# Patient Record
Sex: Male | Born: 1950 | Race: White | Hispanic: No | Marital: Married | Smoking: Never smoker
Health system: Southern US, Community
[De-identification: ages and names within clinical notes are randomized; demographics above are authoritative.]

## PROBLEM LIST (undated history)

## (undated) DIAGNOSIS — R42 Dizziness and giddiness: Secondary | ICD-10-CM

## (undated) DIAGNOSIS — G459 Transient cerebral ischemic attack, unspecified: Secondary | ICD-10-CM

## (undated) DIAGNOSIS — E119 Type 2 diabetes mellitus without complications: Secondary | ICD-10-CM

---

## 2015-06-29 ENCOUNTER — Observation Stay
Admission: EM | Admit: 2015-06-29 | Discharge: 2015-06-30 | Disposition: A | Discharge status: Home / Self Care | Payer: BLUE CROSS/BLUE SHIELD | Attending: Internal Medicine | Admitting: Internal Medicine

## 2015-06-29 ENCOUNTER — Encounter: Payer: Self-pay | Admitting: Emergency Medicine

## 2015-06-29 DIAGNOSIS — F329 Major depressive disorder, single episode, unspecified: Secondary | ICD-10-CM | POA: Diagnosis not present

## 2015-06-29 DIAGNOSIS — I208 Other forms of angina pectoris: Secondary | ICD-10-CM

## 2015-06-29 DIAGNOSIS — Z794 Long term (current) use of insulin: Secondary | ICD-10-CM | POA: Insufficient documentation

## 2015-06-29 DIAGNOSIS — I639 Cerebral infarction, unspecified: Secondary | ICD-10-CM

## 2015-06-29 DIAGNOSIS — R110 Nausea: Secondary | ICD-10-CM | POA: Diagnosis not present

## 2015-06-29 DIAGNOSIS — Z79899 Other long term (current) drug therapy: Secondary | ICD-10-CM | POA: Insufficient documentation

## 2015-06-29 DIAGNOSIS — Z7982 Long term (current) use of aspirin: Secondary | ICD-10-CM | POA: Insufficient documentation

## 2015-06-29 DIAGNOSIS — R197 Diarrhea, unspecified: Secondary | ICD-10-CM | POA: Insufficient documentation

## 2015-06-29 DIAGNOSIS — E785 Hyperlipidemia, unspecified: Secondary | ICD-10-CM | POA: Diagnosis not present

## 2015-06-29 DIAGNOSIS — R61 Generalized hyperhidrosis: Secondary | ICD-10-CM | POA: Insufficient documentation

## 2015-06-29 DIAGNOSIS — E871 Hypo-osmolality and hyponatremia: Secondary | ICD-10-CM | POA: Insufficient documentation

## 2015-06-29 DIAGNOSIS — R42 Dizziness and giddiness: Secondary | ICD-10-CM | POA: Insufficient documentation

## 2015-06-29 DIAGNOSIS — R9431 Abnormal electrocardiogram [ECG] [EKG]: Secondary | ICD-10-CM | POA: Insufficient documentation

## 2015-06-29 DIAGNOSIS — R55 Syncope and collapse: Secondary | ICD-10-CM | POA: Diagnosis present

## 2015-06-29 DIAGNOSIS — R531 Weakness: Secondary | ICD-10-CM | POA: Insufficient documentation

## 2015-06-29 DIAGNOSIS — J029 Acute pharyngitis, unspecified: Secondary | ICD-10-CM | POA: Diagnosis not present

## 2015-06-29 DIAGNOSIS — Z8673 Personal history of transient ischemic attack (TIA), and cerebral infarction without residual deficits: Secondary | ICD-10-CM | POA: Insufficient documentation

## 2015-06-29 DIAGNOSIS — R112 Nausea with vomiting, unspecified: Secondary | ICD-10-CM

## 2015-06-29 DIAGNOSIS — E119 Type 2 diabetes mellitus without complications: Secondary | ICD-10-CM | POA: Insufficient documentation

## 2015-06-29 HISTORY — DX: Dizziness and giddiness: R42

## 2015-06-29 HISTORY — DX: Transient cerebral ischemic attack, unspecified: G45.9

## 2015-06-29 HISTORY — DX: Type 2 diabetes mellitus without complications: E11.9

## 2015-06-29 LAB — BASIC METABOLIC PANEL
ANION GAP: 10 (ref 5–15)
BUN: 20 mg/dL (ref 6–20)
CALCIUM: 9.2 mg/dL (ref 8.9–10.3)
CO2: 23 mmol/L (ref 22–32)
Chloride: 100 mmol/L — ABNORMAL LOW (ref 101–111)
Creatinine, Ser: 0.91 mg/dL (ref 0.61–1.24)
GLUCOSE: 332 mg/dL — AB (ref 65–99)
POTASSIUM: 3.6 mmol/L (ref 3.5–5.1)
SODIUM: 133 mmol/L — AB (ref 135–145)

## 2015-06-29 LAB — GLUCOSE, CAPILLARY: Glucose-Capillary: 192 mg/dL — ABNORMAL HIGH (ref 65–99)

## 2015-06-29 LAB — CBC
HCT: 45.3 % (ref 40.0–52.0)
Hemoglobin: 15.2 g/dL (ref 13.0–18.0)
MCH: 32.3 pg (ref 26.0–34.0)
MCHC: 33.5 g/dL (ref 32.0–36.0)
MCV: 96.2 fL (ref 80.0–100.0)
PLATELETS: 200 10*3/uL (ref 150–440)
RBC: 4.71 MIL/uL (ref 4.40–5.90)
RDW: 12.4 % (ref 11.5–14.5)
WBC: 10.4 10*3/uL (ref 3.8–10.6)

## 2015-06-29 LAB — TROPONIN I

## 2015-06-29 NOTE — ED Notes (Signed)
89 yom presents to ED with c/o near syncope. Per EMS, [facility] [place] [facility] and was in town for a [place]. Around 2000 patient began feeling "weird" (hot, sweaty, and lightheaded). Hx of TIA's. Patient is a type 2 diabetic and felt his blood sugar was dropping. Patient ate a hamburger an hour prior to feeling this way. Patient drank a coke and took a glucose tablet and called EMS. Blood sugar on EMS arrival via fingerstick was 179. Patients pupils became very dilated and patient was near syncopal. EMS started an IV and checked his blood sugar  and checked then and it was 281. Patient recently dx with a sinus infection and has been taking Z-pack since this past Tuesday. Patient is currently A&O. Denies pain.

## 2015-06-30 ENCOUNTER — Observation Stay: Payer: BLUE CROSS/BLUE SHIELD

## 2015-06-30 ENCOUNTER — Observation Stay (HOSPITAL_BASED_OUTPATIENT_CLINIC_OR_DEPARTMENT_OTHER)
Admit: 2015-06-30 | Discharge: 2015-06-30 | Disposition: A | Discharge status: Home / Self Care | Payer: BLUE CROSS/BLUE SHIELD | Attending: Physician Assistant | Admitting: Physician Assistant

## 2015-06-30 ENCOUNTER — Emergency Department: Payer: BLUE CROSS/BLUE SHIELD

## 2015-06-30 DIAGNOSIS — R110 Nausea: Secondary | ICD-10-CM | POA: Diagnosis present

## 2015-06-30 DIAGNOSIS — R55 Syncope and collapse: Secondary | ICD-10-CM

## 2015-06-30 LAB — GLUCOSE, CAPILLARY
GLUCOSE-CAPILLARY: 151 mg/dL — AB (ref 65–99)
Glucose-Capillary: 127 mg/dL — ABNORMAL HIGH (ref 65–99)
Glucose-Capillary: 216 mg/dL — ABNORMAL HIGH (ref 65–99)

## 2015-06-30 LAB — TROPONIN I
Troponin I: <0.03 ng/mL (ref <0.031)
Troponin I: <0.03 ng/mL (ref <0.031)
Troponin I: <0.03 ng/mL (ref <0.031)
Troponin I: <0.03 ng/mL (ref <0.031)

## 2015-06-30 LAB — LIPID PANEL
Cholesterol: 169 mg/dL (ref 0–200)
HDL: 76 mg/dL (ref >40)
LDL Cholesterol: 74 mg/dL (ref 0–99)
Total CHOL/HDL Ratio: 2.2 RATIO
Triglycerides: 94 mg/dL (ref <150)
VLDL: 19 mg/dL (ref 0–40)

## 2015-06-30 LAB — HEMOGLOBIN A1C: Hgb A1c MFr Bld: 8.2 % — ABNORMAL HIGH (ref 4.0–6.0)

## 2015-06-30 LAB — TSH: TSH: 3.612 u[IU]/mL (ref 0.350–4.500)

## 2015-06-30 MED ORDER — LORAZEPAM 2 MG/ML IJ SOLN
2.0000 mg | Freq: Once | INTRAMUSCULAR | Status: AC
  Administered 2015-06-30: 2 mg via INTRAVENOUS
  Filled 2015-06-30: qty 1

## 2015-06-30 MED ORDER — INSULIN ASPART 100 UNIT/ML ~~LOC~~ SOLN
0.0000 [IU] | Freq: Three times a day (TID) | SUBCUTANEOUS | Status: DC
  Administered 2015-06-30: 3 [IU] via SUBCUTANEOUS
  Filled 2015-06-30: qty 3

## 2015-06-30 MED ORDER — ATORVASTATIN CALCIUM 20 MG PO TABS
40.0000 mg | ORAL_TABLET | Freq: Every evening | ORAL | Status: DC
  Administered 2015-06-30: 40 mg via ORAL
  Filled 2015-06-30: qty 2

## 2015-06-30 MED ORDER — HEPARIN SODIUM (PORCINE) 5000 UNIT/ML IJ SOLN: 5000.0000 [IU] | Freq: Three times a day (TID) | INTRAMUSCULAR | Status: DC

## 2015-06-30 MED ORDER — SODIUM CHLORIDE 0.9 % IV SOLN
INTRAVENOUS | Status: DC
  Administered 2015-06-30 (×2): via INTRAVENOUS

## 2015-06-30 MED ORDER — ASPIRIN EC 81 MG PO TBEC
162.0000 mg | DELAYED_RELEASE_TABLET | Freq: Every day | ORAL | Status: DC
  Administered 2015-06-30: 162 mg via ORAL
  Filled 2015-06-30: qty 2

## 2015-06-30 MED ORDER — DOCUSATE SODIUM 100 MG PO CAPS
100.0000 mg | ORAL_CAPSULE | Freq: Two times a day (BID) | ORAL | Status: DC
  Administered 2015-06-30: 100 mg via ORAL
  Filled 2015-06-30: qty 1

## 2015-06-30 MED ORDER — DIPHENHYDRAMINE HCL 50 MG/ML IJ SOLN
12.5000 mg | Freq: Four times a day (QID) | INTRAMUSCULAR | Status: DC | PRN
  Filled 2015-06-30: qty 0.25

## 2015-06-30 MED ORDER — ONDANSETRON HCL 4 MG/2ML IJ SOLN: 4.0000 mg | Freq: Four times a day (QID) | INTRAMUSCULAR | Status: DC | PRN

## 2015-06-30 MED ORDER — SODIUM CHLORIDE 0.9 % IJ SOLN
3.0000 mL | Freq: Two times a day (BID) | INTRAMUSCULAR | Status: DC
  Administered 2015-06-30: 3 mL via INTRAVENOUS

## 2015-06-30 MED ORDER — INSULIN GLARGINE 100 UNIT/ML ~~LOC~~ SOLN
6.0000 [IU] | Freq: Every day | SUBCUTANEOUS | Status: DC
  Filled 2015-06-30: qty 0.06

## 2015-06-30 MED ORDER — PAROXETINE HCL 20 MG PO TABS
10.0000 mg | ORAL_TABLET | Freq: Every day | ORAL | Status: DC
  Administered 2015-06-30: 10 mg via ORAL
  Filled 2015-06-30: qty 1

## 2015-06-30 MED ORDER — MORPHINE SULFATE (PF) 2 MG/ML IV SOLN: 2.0000 mg | INTRAVENOUS | Status: DC | PRN

## 2015-06-30 MED ORDER — ACETAMINOPHEN 650 MG RE SUPP: 650.0000 mg | Freq: Four times a day (QID) | RECTAL | Status: DC | PRN

## 2015-06-30 MED ORDER — ACETAMINOPHEN 325 MG PO TABS
650.0000 mg | ORAL_TABLET | Freq: Four times a day (QID) | ORAL | Status: DC | PRN
Start: 2015-06-30 — End: 2015-06-30

## 2015-06-30 MED ORDER — ONDANSETRON HCL 4 MG PO TABS: 4.0000 mg | ORAL_TABLET | Freq: Four times a day (QID) | ORAL | Status: DC | PRN

## 2015-06-30 MED ORDER — INSULIN ASPART 100 UNIT/ML ~~LOC~~ SOLN: 0.0000 [IU] | Freq: Every day | SUBCUTANEOUS | Status: DC

## 2015-06-30 NOTE — Progress Notes (Addendum)
RN witnessed patient ambulate to the bathroom this AM with no difficulty. With medication administration this AM, patient complained of severe exhaustion and RN put bed alarm on. RN received call from aide 15-20 minutes prior to this and reported patient was needing assistance of 3 nursing students to ambulate to the bathroom. Patient was helped to commode and had diarrhea, complained of dizziness and became very emotional. RN and nursing instructor aided patient back to bed via wheelchair and required +2 assist to return to bed. Bed alarm placed and patient instructed to call next time he gets up. Pt verbalizes understanding of maintaining his safety while here. RN is attempting to page assigning MD to find out who attending will be, as computer does not currently show this.   Update: 9:49 AM MD reached, Dr. Allena KatzPatel is seeing patient. Rounding on floor at this time and updated on patients neurological change. RN asked if MRI would be helpful at this point. MD to see and assess patient.

## 2015-06-30 NOTE — Progress Notes (Signed)
PT Cancellation Note  Patient Details Name: Ronnell FreshwaterJames David Agne MRN: [id number] DOB: 03/07/1951   Cancelled Treatment:    Reason Eval/Treat Not Completed: Other (comment). Pt currently out of room for imaging. Will re-attempt at later time.   Saba Neuman 06/30/2015, 1:25 PM Elizabeth PalauStephanie Stepheni Cameron, PT, DPT [phone]

## 2015-06-30 NOTE — Evaluation (Signed)
Physical Therapy Evaluation Patient Details Name: Mike Ramos MRN: [id number] DOB: 11/23/1950 Today's Date: 06/30/2015   History of Present Illness  [facility], started feeling "strange" having some dizziness, sweating.  He reports he has had similar issues with his diabetes as well as previous TIA/CVA  Clinical Impression  Pt shows very good strength, balance and general confidence with all acts.  He is able to maintain narrow BOS, eyes closed balance with perturbations, negotiates up/down steps easily w/o rails and ambulates > 1.0 m/sec.  Pt at his baseline level and does not need further PT intervention.     Follow Up Recommendations No PT follow up    Equipment Recommendations       Recommendations for Other Services       Precautions / Restrictions Restrictions Weight Bearing Restrictions: No      Mobility  Bed Mobility Overal bed mobility: Independent                Transfers Overall transfer level: Independent               General transfer comment: Pt has no issues with getting to standing and shows good confidence and safety  Ambulation/Gait Ambulation/Gait assistance: Independent Ambulation Distance (Feet): 200 Feet Assistive device: None     Gait velocity interpretation: >2.62 ft/sec, indicative of independent community ambulator General Gait Details: Pt with no issues during ambualtion with good speed, confidence and balance.   Stairs Stairs: Yes       General stair comments: Pt able to negotiate up/down 4 steps w/o rails and w/o issue  Wheelchair Mobility    Modified Rankin (Stroke Patients Only)       Balance Overall balance assessment: Independent                                           Pertinent Vitals/Pain Pain Assessment: No/denies pain    Home Living Family/patient expects to be discharged to:: Private residence Living Arrangements: Spouse/significant other   Type of  Home: House Home Access: Stairs to enter   [employer] Corporation[employer]trance Stairs-Number of Steps: 2          Prior Function Level of Independence: Independent         Comments: very active, plays in a band, works full time and exercises regularly     Hand Dominance        Extremity/Trunk Assessment   Upper Extremity Assessment: Overall WFL for tasks assessed           Lower Extremity Assessment: Overall WFL for tasks assessed         Communication   Communication: No difficulties  Cognition Arousal/Alertness: Awake/alert Behavior During Therapy: WFL for tasks assessed/performed Overall Cognitive Status: Within Functional Limits for tasks assessed                      General Comments      Exercises        Assessment/Plan    PT Assessment Patent does not need any further PT services  PT Diagnosis Generalized weakness;Difficulty walking   PT Problem List    PT Treatment Interventions     PT Goals (Current goals can be found in the Care Plan section) Acute Rehab PT Goals PT Goal Formulation: With patient/family    Frequency     Barriers to discharge  Co-evaluation               End of Session Equipment Utilized During Treatment: Gait belt Activity Tolerance: Patient tolerated treatment well Patient left: in bed Nurse Communication: Mobility status    Functional Assessment Tool Used: clinical judgement Functional Limitation: Mobility: Walking and moving around Mobility: Walking and Moving Around Current Status [phone]): 0 percent impaired, limited or restricted Mobility: Walking and Moving Around Goal Status [phone]): 0 percent impaired, limited or restricted Mobility: Walking and Moving Around Discharge Status [phone]): 0 percent impaired, limited or restricted    Time: 2956-2130 PT Time Calculation (min) (ACUTE ONLY): 12 min   Charges:   PT Evaluation $Initial PT Evaluation Tier I: 1 Procedure     PT G Codes:   PT G-Codes **NOT FOR  INPATIENT CLASS** Functional Assessment Tool Used: clinical judgement Functional Limitation: Mobility: Walking and moving around Mobility: Walking and Moving Around Current Status (Q6578): 0 percent impaired, limited or restricted Mobility: Walking and Moving Around Goal Status (I6962): 0 percent impaired, limited or restricted Mobility: Walking and Moving Around Discharge Status (X5284): 0 percent impaired, limited or restricted   Mike Ramos, PT, DPT [phone]  Mike Ramos 06/30/2015, 3:02 PM

## 2015-06-30 NOTE — Progress Notes (Signed)
Pt. Discharged to home via wc with wife. Discharge instructions and medication regimen reviewed at bedside with patient. Pt. verbalizes understanding of instructions and medication regimen. Patient assessment unchanged from this morning. TELE and IV discontinued per policy.

## 2015-06-30 NOTE — ED Provider Notes (Signed)
[facility] Emergency Department Provider Note  REMINDER - THIS NOTE IS NOT A FINAL MEDICAL RECORD UNTIL IT IS SIGNED. UNTIL THEN, THE CONTENT BELOW MAY REFLECT INFORMATION FROM A DOCUMENTATION TEMPLATE, NOT THE ACTUAL PATIENT VISIT. ____________________________________________  Time seen: Approximately 940P  I have reviewed the triage vital signs and the nursing notes.   HISTORY  Chief Complaint Near Syncope    HPI Mike Ramos is a 64 y.o. male puts a previous history of diabetes, currently utilizing Lantus, as well as vertigo.  He also tells me he had a "TIA" once previously, however he was told this was due to his low blood sugar and denies that he was ever told he needed to be on aspirin or ever had a stroke or mini stroke.  No history of heart disease.  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 He reports this happened before when his sugar has been low. He got sweaty and lightheaded. He immediately started taking sugar tablets, and drank a Coke. He then reports that his symptoms improved significantly after about 15-20 minutes. At the present time all symptoms are resolved. His friend who is with him reports that he never lost consciousness, though he did appear quite pale and so he was going to at one point. EMS also reports to me that he had no syncope.  He is not having any chest pain, trouble breathing. He denies any numbness, tingling, facial droop, headache or weakness in arm or leg.  He does use alcohol but only about 1-2 drinks per day and no history of withdrawals or heavy use on a regular basis.   Past Medical History  Diagnosis Date  . TIA (transient ischemic attack)   . Diabetes mellitus without complication (HCC)   . Vertigo     There are no active problems to display for this patient.   No past surgical history on file.  Current Outpatient Rx  Name  Route   Sig  Dispense  Refill  . atorvastatin (LIPITOR) 40 MG tablet   Oral   Take 1 tablet by mouth every evening.         Marland Kitchen azithromycin (ZITHROMAX) 250 MG tablet   Oral   Take 1 tablet by mouth daily.         Marland Kitchen PARoxetine (PAXIL) 10 MG tablet   Oral   Take 1 tablet by mouth daily.           Allergies Codeine  No family history on file.  Social History Social History  Substance Use Topics  . Smoking status: Never Smoker   . Smokeless tobacco: Not on file  . Alcohol Use: Yes     Comment: 1 bottle of beer a day    Review of Systems Constitutional: No fever/chills Eyes: No visual changes. ENT: No sore throat. Cardiovascular: Denies chest pain. Respiratory: Denies shortness of breath. Gastrointestinal: No abdominal pain.  No vomiting.  No diarrhea.  No constipation. Genitourinary: Negative for dysuria. Musculoskeletal: Negative for back pain. Skin: Negative for rash. Neurological: Negative for headaches, focal weakness or numbness.  10-point ROS otherwise negative.  ____________________________________________   PHYSICAL EXAM:  VITAL SIGNS: ED Triage Vitals  Enc Vitals Group     BP 06/29/15 2100 189/95 mmHg     Pulse Rate 06/29/15 2100 66     Resp 06/29/15 2100 15     Temp 06/29/15 2108 97.4 F (36.3 C)  Temp src --      SpO2 06/29/15 2100 100 %     Weight 06/29/15 2108 190 lb (86.183 kg)     Height 06/29/15 2108 6' (1.829 m)     Head Cir --      Peak Flow --      Pain Score --      Pain Loc --      Pain Edu? --      Excl. in GC? --    Based on the patient complaint we performed a very thorough cardio, pulmonary, neurologic exam.  Constitutional: Alert and oriented. Well appearing and in no acute distress. Eyes: Conjunctivae are normal. PERRL. EOMI. Head: Atraumatic. Nose: No congestion/rhinnorhea. Mouth/Throat: Mucous membranes are moist.  Oropharynx non-erythematous. Neck: No stridor.   Cardiovascular: Normal rate, regular rhythm. Grossly  normal heart sounds.  Good peripheral circulation. Respiratory: Normal respiratory effort.  No retractions. Lungs CTAB. Gastrointestinal: Soft and nontender. No distention. No abdominal bruits. No CVA tenderness. Musculoskeletal: No lower extremity tenderness nor edema.  No joint effusions. Neurologic:  Normal speech and language. No gross focal neurologic deficits are appreciated. No gait instability. Proximal to the bathroom well without any assistance.  NIH score equals 0, performed by me at bedside. The patient has no pronator drift. The patient has normal cranial nerve exam. Extraocular movements are normal. Visual fields are normal. Patient has 5 out of 5 strength in all extremities. There is no numbness or gross, acute sensory abnormality in the extremities bilaterally. No speech disturbance. No dysarthria. No aphasia. No ataxia. Normal finger nose finger bilat. Patient speaking in full and clear sentences.   Skin:  Skin is warm, dry and intact. No rash noted. Psychiatric: Mood and affect are normal. Speech and behavior are normal.  ____________________________________________   LABS (all labs ordered are listed, but only abnormal results are displayed)  Labs Reviewed  BASIC METABOLIC PANEL - Abnormal; Notable for the following:    Sodium 133 (*)    Chloride 100 (*)    Glucose, Bld 332 (*)    All other components within normal limits  GLUCOSE, CAPILLARY - Abnormal; Notable for the following:    Glucose-Capillary 192 (*)    All other components within normal limits  GLUCOSE, CAPILLARY - Abnormal; Notable for the following:    Glucose-Capillary 151 (*)    All other components within normal limits  CBC  TROPONIN I  TROPONIN I  CBG MONITORING, ED  CBG MONITORING, ED   ____________________________________________  EKG  Reviewed and interpreted by me at 2105 Heart rate 62 There is minimal J-point versus ST elevation noted in V3, with likely J-point elevation in  lateral leads. Reviewed and discussed with Dr. Tomie ChinaZane who advises likely early repolarization, but continue serial EKG monitoring.  Normal QRS, normal QTC normal PR interval  Reviewed and interpreted by me at 2144 Heart rate 58 There is minimal J-point versus ST elevation noted in V3, with likely J-point elevation in lateral leads.  Normal QRS, normal QTC normal PR interval  Reviewed and interpreted by me at 0040 Heart rate 59 There is minimal J-point versus ST elevation noted in V3, with likely J-point elevation in lateral leads.  Normal QRS, normal QTC normal PR interval  ____________________________________________  RADIOLOGY  CT head pending at the site of transfer of care ____________________________________________   PROCEDURES  Procedure(s) performed: None  Critical Care performed: No  ____________________________________________   INITIAL IMPRESSION / ASSESSMENT AND PLAN / ED COURSE  Pertinent labs &  imaging results that were available during my care of the patient were reviewed by me and considered in my medical decision making (see chart for details).  Patient presents with a resolved episode of severe nausea diaphoresis, and feeling as though he was about to pass out but did not. He did not have syncope, and his exam including complete resolution of symptoms as well as neurologic examination and cardio pulmonary exam reassuring. EKG discussed with Dr. Tomie China of cardiology who reviewed it via MUSE with me over the phone, he advises that likely early repolarization though acute ischemic abnormality can be seen with similar T-wave appearance advises serial EKGs and serial observation and monitoring. Patient's visit we do not have any chest pain or trouble breathing. His vital signs are reassuring, though he is moderately hypertensive. We will observe him closely in the ER, particularly as we evaluate and rule out cardiac  symptoms.  ----------------------------------------- 12:35 AM on 06/30/2015 -----------------------------------------  Micah Flesher to reevaluate the patient reports that about 5 minutes ago his symptoms Started to come back if he feels nauseated, sweaty, and various generally ill feeling. He remains on a monitor and appears stable with reassuring and stable neurologic examination. Based on his previous history, question lead EKG in ongoing observation status for chest discomfort or possible acute coronary syndrome was silent MI and history of diabetes will obtain a repeat EKG now, repeat glucose, and I'll add a CT of the head to evaluate for intracranial hemorrhage, though no clear evidence of acute stroke at this time.  Ongoing care and disposition assigned to Dr. sung will follow-up on CT head, plan is to admit the patient who is agreeable as he is essentially failing are observation with recurrence of symptoms now. ____________________________________________   FINAL CLINICAL IMPRESSION(S) / ED DIAGNOSES  Final diagnoses:  Near syncope  Nausea vomiting and diarrhea  Anginal equivalent (HCC)      Sharyn Creamer, MD 06/30/15 [phone]

## 2015-06-30 NOTE — Progress Notes (Addendum)
[facility] [place] [facility]                                                                                                                                                                                            Patient Demographics   Mike OxfordJames Ramos, is a 64 y.o. male, DOB - 08/14/1950, ZOX:[id number]  Admit date - 06/29/2015   Admitting Physician Mike NatalMichael S Diamond, MD  Outpatient Primary MD for the patient is Pcp Not In System   LOS -   Subjective: Patient admitted with near syncope due to hypoglycemia was also having nausea. She was thought to have some EKG changes therefore recommended to be hospitalized. Currently he denies any chest pain nausea is resolved 8 all his breakfast. However according to the family they have noticed he's had some issues with slurred speech recently. And when the nurse tried ambulating and patient did become very weak.     Review of Systems:   CONSTITUTIONAL: No documented fever. No fatigue, weakness. No weight gain, no weight loss.  EYES: No blurry or double vision.  ENT: No tinnitus. No postnasal drip. No redness of the oropharynx.  RESPIRATORY: No cough, no wheeze, no hemoptysis. No dyspnea.  CARDIOVASCULAR: No chest pain. No orthopnea. No palpitations. No syncope.  GASTROINTESTINAL: No nausea, no vomiting or diarrhea. No abdominal pain. No melena or hematochezia.  GENITOURINARY: No dysuria or hematuria.  ENDOCRINE: No polyuria or nocturia. No heat or cold intolerance.  HEMATOLOGY: No anemia. No bruising. No bleeding.  INTEGUMENTARY: No rashes. No lesions.  MUSCULOSKELETAL: No arthritis. No swelling. No gout.  NEUROLOGIC: No numbness, tingling, or ataxia. No seizure-type activity. Slurred speech PSYCHIATRIC: No anxiety. No insomnia. No ADD.    Vitals:   Filed Vitals:   06/30/15 0915 06/30/15 1053 06/30/15 1055 06/30/15 1100  BP: 148/79 138/84 122/74 140/86  Pulse:  62 68 76  Temp:      TempSrc:      Resp:       Height:      Weight:      SpO2:        Wt Readings from Last 3 Encounters:  06/30/15 81.784 kg (180 lb 4.8 oz)     Intake/Output Summary (Last 24 hours) at 06/30/15 1423 Last data filed at 06/30/15 1130  Gross per 24 hour  Intake    240 ml  Output    400 ml  Net   -160 ml    Physical Exam:   GENERAL: Pleasant-appearing in no apparent distress.  HEAD, EYES, EARS, NOSE AND THROAT: Atraumatic, normocephalic. Extraocular muscles are intact. Pupils equal and  reactive to light. Sclerae anicteric. No conjunctival injection. No oro-pharyngeal erythema.  NECK: Supple. There is no jugular venous distention. No bruits, no lymphadenopathy, no thyromegaly.  HEART: Regular rate and rhythm,. No murmurs, no rubs, no clicks.  LUNGS: Clear to auscultation bilaterally. No rales or rhonchi. No wheezes.  ABDOMEN: Soft, flat, nontender, nondistended. Has good bowel sounds. No hepatosplenomegaly appreciated.  EXTREMITIES: No evidence of any cyanosis, clubbing, or peripheral edema.  +2 pedal and radial pulses bilaterally.  NEUROLOGIC: The patient is alert, awake, and oriented x3 with no focal motor or sensory deficits appreciated bilaterally.  SKIN: Moist and warm with no rashes appreciated.  Psych: Not anxious, depressed LN: No inguinal LN enlargement    Antibiotics   Anti-infectives    None      Medications   Scheduled Meds: . aspirin EC  162 mg Oral Daily  . atorvastatin  40 mg Oral QPM  . docusate sodium  100 mg Oral BID  . heparin  5,000 Units Subcutaneous 3 times per day  . insulin aspart  0-5 Units Subcutaneous QHS  . insulin aspart  0-9 Units Subcutaneous TID WC  . insulin glargine  6 Units Subcutaneous QHS  . LORazepam  2 mg Intravenous Once  . PARoxetine  10 mg Oral Daily  . sodium chloride  3 mL Intravenous Q12H   Continuous Infusions: . sodium chloride 125 mL/hr at 06/30/15 1355   PRN Meds:.acetaminophen **OR** acetaminophen, diphenhydrAMINE, morphine injection,  ondansetron **OR** ondansetron (ZOFRAN) IV   Data Review:   Micro Results No results found for this or any previous visit (from the past 240 hour(s)).  Radiology Reports Ct Head Wo Contrast  06/30/2015  CLINICAL DATA:  Lightheadedness and diaphoresis.  Near syncope. EXAM: CT HEAD WITHOUT CONTRAST TECHNIQUE: Contiguous axial images were obtained from the base of the skull through the vertex without intravenous contrast. COMPARISON:  None. FINDINGS: There is no intracranial hemorrhage, mass or evidence of acute infarction. There is focal encephalomalacia in the anterior right temporal lobe, likely sequelae from remote insult such as trauma or infarction. Remainder of the brain is normal in appearance, with normal gray-white differentiation. No extra-axial fluid collection. No bony abnormality. The visible paranasal sinuses are clear except for opacification of several ethmoid air cells. IMPRESSION: No acute intracranial findings. 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 Electronically Signed   By: Ellery Plunk M.D.   On: 06/30/2015 01:13     CBC  Recent Labs Lab 06/29/15 2118  WBC 10.4  HGB 15.2  HCT 45.3  PLT 200  MCV 96.2  MCH 32.3  MCHC 33.5  RDW 12.4    Chemistries   Recent Labs Lab 06/29/15 2118  NA 133*  K 3.6  CL 100*  CO2 23  GLUCOSE 332*  BUN 20  CREATININE 0.91  CALCIUM 9.2   ------------------------------------------------------------------------------------------------------------------ estimated creatinine clearance is 91.2 mL/min (by C-G formula based on Cr of 0.91). ------------------------------------------------------------------------------------------------------------------ No results for input(s): HGBA1C in the last 72 hours. ------------------------------------------------------------------------------------------------------------------  Recent Labs  06/30/15 0919  CHOL 169  HDL 76  LDLCALC 74  TRIG 94   CHOLHDL 2.2   ------------------------------------------------------------------------------------------------------------------  Recent Labs  06/30/15 0317  TSH 3.612   ------------------------------------------------------------------------------------------------------------------ No results for input(s): VITAMINB12, FOLATE, FERRITIN, TIBC, IRON, RETICCTPCT in the last 72 hours.  Coagulation profile No results for input(s): INR, PROTIME in the last 168 hours.  No results for input(s): DDIMER in the last 72 hours.  Cardiac Enzymes  Recent Labs Lab 06/30/15  0020 06/30/15 0507 06/30/15 0919  TROPONINI <0.03 <0.03 <0.03   ------------------------------------------------------------------------------------------------------------------ Invalid input(s): POCBNP    Assessment & Plan   This is a 64 year old Caucasian male admitted for near syncope and mildly abnormal EKG. 1. Near syncope: Possibly secondary to hypoglycemia. Due to some speech changes and gait abnormality will get a MRI of the brain and make sure he is not had a stroke 2. Abnormal EKG: ST segment appears to show early repolarization. Unlikely to be etiology of near syncope. Cardiac enzymes are negative no chest pain needs outpatient follow-up with cardiology 3. Diabetes mellitus type II: We will continue the patient's basal insulin and add sliding scale as needed while hospitalized. The patient is on Invokana which has a relatively high incidence of hypoglycemia. Needs to follow primary care provider to decide if this is appropriate medication for him or not.  4. Hyperlipidemia: Continue statin therapy 5. Hyponatremia: Mild; hydrate with saline. 6. Depression: Continue Paxil 7. DVT prophylaxis: Heparin  8. GI prophylaxis: None as the patient is a critically ill at this time     Code Status Orders        Start     Ordered   06/30/15 0317  Full code   Continuous     06/30/15 0316               DVT Prophylaxis  Heparin   Lab Results  Component Value Date   PLT 200 06/29/2015     Time Spent in minutes     Auburn Bilberry M.D on 06/30/2015 at 2:23 PM  Between 7am to 6pm - Pager - [phone]  After 6pm go to [email or web address] - password [email or web address] [facility]  [facility] [place]   Office  [phone]

## 2015-06-30 NOTE — ED Notes (Signed)
Patient transported to CT 

## 2015-06-30 NOTE — Consult Note (Signed)
Cardiology Consultation Note  Patient ID: Mike Ramos, MRN: [id number], DOB/AGE: 63/08/1950 63 y.o. Admit date: 06/29/2015   Date of Consult: 06/30/2015 Primary Physician: Pcp Not In System Primary Cardiologist: New to [facility]  Chief Complaint: Nausea  Reason for Consult: Presyncope  HPI: 64 y.o. 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   [facility] in the setting his above TIAs in 2014. He reports undergoing stress testing at that time which was self reportedly normal. He also reports he temeletry at that time was normal. He denies ever undergoing cardiac catheterization. At that time he was found to have newly diagnosed DM with blood sugars in the 700s with a TC of 350. He has since had good control of he blood glucose with sugars ranging in the 70 to 110 range. No further TIA-like symptoms. He had traveled up to [place] from [place] for a concert on 12/2 with his band. He did not eat much other than his breakfast and a salad that day. While playing his trumpet he began to feel quite weak and diaphoretic. His sound guy gave him a Coke and called EMS. Upon their arrival he was given a glucose pack and taken to [facility]. He tells me he was not having any chest pain, tachy-palpitations, SOB, or vomiting. He had been feeling great up until 12/2. He walks 5 miles daily and plays 2 rounds of golf weekly. He does not smoke, drinks 1 beer daily, and previously smoke marijuana in college.   Upon the patient's arrival to [facility] they were found to have negative troponin x 3, glucose 333-->127, SCr 0.91, K+ 3.6, TSH normal, unremarkable CBC. ECG showed NSR, 62 bpm, right axis deviation, possible limb lead reversal, early repolarization. , CXR not done. CT head with no acute intracranial findings. Anterior right temporal encephalomalacia, likely  2/2 remote insult.    Past Medical History  Diagnosis Date  . TIA (transient ischemic attack)   . Diabetes mellitus without complication (HCC)   . Vertigo       Most Recent Cardiac Studies: None   Surgical History: History reviewed. No pertinent past surgical history.   Home Meds: Prior to Admission medications   Medication Sig Start Date End Date Taking? Authorizing Provider  aspirin EC 81 MG tablet Take 162 mg by mouth daily.   Yes Historical Provider, MD  atorvastatin (LIPITOR) 40 MG tablet Take 1 tablet by mouth every evening.   Yes Historical Provider, MD  azithromycin (ZITHROMAX) 250 MG tablet Take 1 tablet by mouth daily. 06/26/15 07/01/15 Yes Historical Provider, MD  canagliflozin (INVOKANA) 100 MG TABS tablet Take 100 mg by mouth daily before breakfast.   Yes Historical Provider, MD  esomeprazole (NEXIUM) 20 MG capsule Take 20 mg by mouth daily at 12 noon.   Yes Historical Provider, MD  insulin glargine (LANTUS) 100 UNIT/ML injection Inject 10 Units into the skin at bedtime.   Yes Historical Provider, MD  PARoxetine (PAXIL) 10 MG tablet Take 1 tablet by mouth daily.   Yes Historical Provider, MD    Inpatient Medications:  . aspirin EC  162 mg Oral Daily  . atorvastatin  40 mg Oral QPM  . docusate sodium  100 mg Oral BID  . heparin  5,000 Units Subcutaneous 3 times per day  . insulin aspart  0-5 Units Subcutaneous QHS  . insulin aspart  0-9 Units Subcutaneous TID WC  . insulin glargine  6 Units Subcutaneous QHS  . PARoxetine  10 mg Oral Daily  . sodium chloride  3 mL Intravenous Q12H   . sodium chloride 125 mL/hr at 06/30/15 0330    Allergies:  Allergies  Allergen Reactions  . Codeine Hives    Social History   Social History  . Marital Status: Married    Spouse Name: N/A  . Number of Children: N/A  . Years of Education: N/A   Occupational History  . Not on file.   Social History Main Topics  . Smoking status: Never Smoker   . Smokeless tobacco: Not on  file  . Alcohol Use: Yes     Comment: 1 bottle of beer a day  . Drug Use: Not on file  . Sexual Activity: Not on file   Other Topics Concern  . Not on file   Social History Narrative     History reviewed. No pertinent family history.   Review of Systems: Review of Systems  Constitutional: Positive for malaise/fatigue and diaphoresis. Negative for fever, chills and weight loss.  HENT: Negative for congestion.   Eyes: Negative for blurred vision, double vision, photophobia, pain, discharge and redness.  Respiratory: Negative for cough, hemoptysis, sputum production, shortness of breath and wheezing.   Cardiovascular: Negative for chest pain, palpitations, orthopnea, claudication, leg swelling and PND.  Gastrointestinal: Negative for nausea, vomiting and abdominal pain.  Musculoskeletal: Negative for myalgias, falls and neck pain.  Skin: Negative for rash.  Neurological: Positive for dizziness and weakness. Negative for tingling, sensory change, speech change, focal weakness, seizures and loss of consciousness.  Endo/Heme/Allergies: Does not bruise/bleed easily.  Psychiatric/Behavioral: Negative for substance abuse. The patient is not nervous/anxious.   All other systems reviewed and are negative.   Labs:  Recent Labs  06/29/15 2118 06/30/15 0020 06/30/15 0507  TROPONINI <0.03 <0.03 <0.03   Lab Results  Component Value Date   WBC 10.4 06/29/2015   HGB 15.2 06/29/2015   HCT 45.3 06/29/2015   MCV 96.2 06/29/2015   PLT 200 06/29/2015    Recent Labs Lab 06/29/15 2118  NA 133*  K 3.6  CL 100*  CO2 23  BUN 20  CREATININE 0.91  CALCIUM 9.2  GLUCOSE 332*   No results found for: CHOL, HDL, LDLCALC, TRIG No results found for: DDIMER  Radiology/Studies:  Ct Head Wo Contrast  06/30/2015  CLINICAL DATA:  Lightheadedness and diaphoresis.  Near syncope. EXAM: CT HEAD WITHOUT CONTRAST TECHNIQUE: Contiguous axial images were obtained from the base of the skull through the  vertex without intravenous contrast. COMPARISON:  None. FINDINGS: There is no intracranial hemorrhage, mass or evidence of acute infarction. There is focal encephalomalacia in the anterior right temporal lobe, likely sequelae from remote insult such as trauma or infarction. Remainder of the brain is normal in appearance, with normal gray-white differentiation. No extra-axial fluid collection. No bony abnormality. The visible paranasal sinuses are clear except for opacification of several ethmoid air cells. IMPRESSION: No acute intracranial findings. 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 Electronically Signed   By: Ellery Plunk M.D.   On: 06/30/2015 01:13    EKG: NSR, 62 bpm, right axis deviation, possible limb lead reversal, early repolarization.   Weights: Filed Weights   06/29/15 2108 06/30/15 0317  Weight: 190 lb (86.183 kg) 180 lb 4.8 oz (81.784 kg)     Physical Exam:  Blood pressure 148/79, pulse 57, temperature 97.5 F (36.4 C), temperature source Axillary, resp. rate 18, height 6' (1.829 m), weight 180 lb 4.8 oz (81.784 kg), SpO2 100 %. Body mass index is 24.45 kg/(m^2). General: Well developed, well nourished, in no acute distress. Head: Normocephalic, atraumatic, sclera non-icteric, no xanthomas, nares are without discharge.  Neck: Negative for carotid bruits. JVD not elevated. Lungs: Clear bilaterally to auscultation without wheezes, rales, or rhonchi. Breathing is unlabored. Heart: RRR with S1 S2. No murmurs, rubs, or gallops appreciated. Abdomen: Soft, non-tender, non-distended with normoactive bowel sounds. No hepatomegaly. No rebound/guarding. No obvious abdominal masses. Msk:  Strength and tone appear normal for age. Extremities: No clubbing or cyanosis. No edema.  Distal pedal pulses are 2+ and equal bilaterally. Neuro: Alert and oriented X 3. No facial asymmetry. No focal deficit. Moves all extremities spontaneously. Psych:  Responds to  questions appropriately with a normal affect.    Assessment and Plan:   1. Presyncope: -Recommend orthostatic vital signs given the patient's PMH and history -Check echo to evaluate LV function and wall motion, if normal likely no further cardiac work up at this time -Check carotid dopplers  -Can monitor on telemetry while inpatient for significant arrhythmia or pause, currently in sinus rhythm in the 60's -Consider outpatient cardiac monitoring if indicated, though suspect noncardiac etiology   2. ECG: -Non-acute -Patient walks 5 miles daily and plays 2 rounds of golf weekly -Recent self reported stress test in 2014 that was negative -No anginal symptoms -Consider outpatient evaluation with cardiology back home in [facility]  3. DM2: -Per IM   Signed, Eula Listen, PA-C Pager: [phone] 06/30/2015, 9:21 AM   History and all data above reviewed.  Patient examined.  I agree with the findings as above.  All available labs, radiology testing, previous records reviewed. Agree with documented assessment and plan.  Mr. Vandewater' echo does not reveal any significant abnormalities.  He had a stress test 2 years ago that did not reveal ischemia.  He has no signs or symptoms of heart failure on exam.  Recommend outpatient cardiology follow up in [place].  Could consider ambulatory rhythm assessment.  Marlo Arriola C. Duke Salvia, MD, [facility]  07/02/2015 1:37 PM

## 2015-06-30 NOTE — Discharge Instructions (Signed)
°  DIET:  Diabetic diet  DISCHARGE CONDITION:  Stable  ACTIVITY:  Activity as tolerated  OXYGEN:  Home Oxygen: No.   Oxygen Delivery: room air  DISCHARGE LOCATION:  home    ADDITIONAL DISCHARGE INSTRUCTION: need to eat properly, keep log of blood glucose to take to primary md   If you experience worsening of your admission symptoms, develop shortness of breath, life threatening emergency, suicidal or homicidal thoughts you must seek medical attention immediately by calling 911 or calling your MD immediately  if symptoms less severe.  You Must read complete instructions/literature along with all the possible adverse reactions/side effects for all the Medicines you take and that have been prescribed to you. Take any new Medicines after you have completely understood and accpet all the possible adverse reactions/side effects.   Please note  You were cared for by a hospitalist during your hospital stay. If you have any questions about your discharge medications or the care you received while you were in the hospital after you are discharged, you can call the unit and asked to speak with the hospitalist on call if the hospitalist that took care of you is not available. Once you are discharged, your primary care physician will handle any further medical issues. Please note that NO REFILLS for any discharge medications will be authorized once you are discharged, as it is imperative that you return to your primary care physician (or establish a relationship with a primary care physician if you do not have one) for your aftercare needs so that they can reassess your need for medications and monitor your lab values.

## 2015-06-30 NOTE — Progress Notes (Signed)
*  PRELIMINARY RESULTS* Echocardiogram 2D Echocardiogram has been performed.  Garrel Ridgelikeshia S Stills 06/30/2015, 1:35 PM

## 2015-06-30 NOTE — Progress Notes (Signed)
Pt oriented to room.  Pt scored a low fall risk but advised that if he needs like he could faint/dizzy/sweaty- to please sit back down and call out for assistance.  Skin verified with Yaakov GuthrieMarcel-- abrasion to left shin.  Pt very tired, plan of care reviewed with him.  Pt resting at this time.  Will continue to monitor. Louis MeckelMcRae, Winona Sison H

## 2015-06-30 NOTE — Progress Notes (Signed)
Patient and wife requesting to discharge without waiting for MRI results. They state they live 5 hours away and do not want to drive home in the middle of the night; they report they will call this hospital to obtain his results. Dr. Allena KatzPatel paged and gave approval of this.

## 2015-06-30 NOTE — ED Provider Notes (Signed)
-----------------------------------------   2:00 AM on 06/30/2015 -----------------------------------------  CT head interpreted per Dr. Clovis RileyMitchell: No acute intracranial findings. 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  Spoke with hospitalist who will evaluate patient in the emergency department for admission.  Irean HongJade J Sung, MD 06/30/15 [phone]

## 2015-06-30 NOTE — H&P (Addendum)
Mike Ramos is an 64 y.o. male.   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 The patient denies drinking alcohol, but admits that he did not eat well today. The patient also states that he has had a sore throat and has been taking a Z-Pak. He states that he initially became diaphoretic and then nauseous. He denies vomiting. He states he felt like this in the past when his blood sugar was low, so he drank half a Coca-Cola and ate a glucose gel pack. He states he felt better after this but was encouraged to come to the hospital for evaluation. While in the emergency department the patient's nausea returned, again without vomiting. His recurrent symptoms in addition to an EKG with somewhat abnormal ST segment prompted the emergency department staff to call for admission.  Past Medical History  Diagnosis Date  . TIA (transient ischemic attack)   . Diabetes mellitus without complication ([place])   . Vertigo     No past surgical history on file. Minor laceration repairs as a child  No family history on file. None Social History:  reports that he has never smoked. He does not have any smokeless tobacco history on file. He reports that he drinks alcohol. His drug history is not on file.  Allergies:  Allergies  Allergen Reactions  . Codeine Hives    Prior to Admission medications   Medication Sig Start Date End Date Taking? Authorizing Provider  aspirin EC 81 MG tablet Take 162 mg by mouth daily.   Yes Historical Provider, MD  atorvastatin (LIPITOR) 40 MG tablet Take 1 tablet by mouth every evening.   Yes Historical Provider, MD  azithromycin (ZITHROMAX) 250 MG tablet Take 1 tablet by mouth daily. 06/26/15 07/01/15 Yes Historical Provider, MD  canagliflozin (INVOKANA) 100 MG TABS tablet Take 100 mg by mouth daily before breakfast.   Yes Historical Provider, MD  esomeprazole (NEXIUM)  20 MG capsule Take 20 mg by mouth daily at 12 noon.   Yes Historical Provider, MD  insulin glargine (LANTUS) 100 UNIT/ML injection Inject 10 Units into the skin at bedtime.   Yes Historical Provider, MD  PARoxetine (PAXIL) 10 MG tablet Take 1 tablet by mouth daily.   Yes Historical Provider, MD     Results for orders placed or performed during the hospital encounter of 06/29/15 (from the past 48 hour(s))  CBC     Status: None   Collection Time: 06/29/15  9:18 PM  Result Value Ref Range   WBC 10.4 3.8 - 10.6 K/uL   RBC 4.71 4.40 - 5.90 MIL/uL   Hemoglobin 15.2 13.0 - 18.0 g/dL   HCT 45.3 40.0 - 52.0 %   MCV 96.2 80.0 - 100.0 fL   MCH 32.3 26.0 - 34.0 pg   MCHC 33.5 32.0 - 36.0 g/dL   RDW 12.4 11.5 - 14.5 %   Platelets 200 150 - 440 K/uL  Basic metabolic panel     Status: Abnormal   Collection Time: 06/29/15  9:18 PM  Result Value Ref Range   Sodium 133 (L) 135 - 145 mmol/L   Potassium 3.6 3.5 - 5.1 mmol/L   Chloride 100 (L) 101 - 111 mmol/L   CO2 23 22 - 32 mmol/L   Glucose, Bld 332 (H) 65 - 99 mg/dL   BUN 20 6 - 20 mg/dL   Creatinine, Ser 0.91 0.61 - 1.24 mg/dL  Calcium 9.2 8.9 - 10.3 mg/dL   GFR calc non Af Amer >60 >60 mL/min   GFR calc Af Amer >60 >60 mL/min    Comment: (NOTE) The eGFR has been calculated using the CKD EPI equation. This calculation has not been validated in all clinical situations. eGFR's persistently <60 mL/min signify possible Chronic Kidney Disease.    Anion gap 10 5 - 15  Troponin I     Status: None   Collection Time: 06/29/15  9:18 PM  Result Value Ref Range   Troponin I <0.03 <0.031 ng/mL    Comment:        NO INDICATION OF MYOCARDIAL INJURY.   Glucose, capillary     Status: Abnormal   Collection Time: 06/29/15 11:26 PM  Result Value Ref Range   Glucose-Capillary 192 (H) 65 - 99 mg/dL  Troponin I     Status: None   Collection Time: 06/30/15 12:20 AM  Result Value Ref Range   Troponin I <0.03 <0.031 ng/mL    Comment:        NO  INDICATION OF MYOCARDIAL INJURY.   Glucose, capillary     Status: Abnormal   Collection Time: 06/30/15 12:33 AM  Result Value Ref Range   Glucose-Capillary 151 (H) 65 - 99 mg/dL   Ct Head Wo Contrast  06/30/2015  CLINICAL DATA:  Lightheadedness and diaphoresis.  Near syncope. EXAM: CT HEAD WITHOUT CONTRAST TECHNIQUE: Contiguous axial images were obtained from the base of the skull through the vertex without intravenous contrast. COMPARISON:  None. FINDINGS: There is no intracranial hemorrhage, mass or evidence of acute infarction. There is focal encephalomalacia in the anterior right temporal lobe, likely sequelae from remote insult such as trauma or infarction. Remainder of the brain is normal in appearance, with normal gray-white differentiation. No extra-axial fluid collection. No bony abnormality. The visible paranasal sinuses are clear except for opacification of several ethmoid air cells. IMPRESSION: No acute intracranial findings. 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 Electronically Signed   By: Andreas Newport M.D.   On: 06/30/2015 01:13    Review of Systems  Constitutional: Positive for malaise/fatigue. Negative for fever and chills.  HENT: Negative for sore throat and tinnitus.   Eyes: Negative for blurred vision and redness.  Respiratory: Negative for cough and shortness of breath.   Cardiovascular: Negative for chest pain, palpitations, orthopnea and PND.  Gastrointestinal: Positive for nausea. Negative for vomiting, abdominal pain and diarrhea.  Genitourinary: Negative for dysuria, urgency and frequency.  Musculoskeletal: Negative for myalgias and joint pain.  Skin: Negative for rash.       No lesions  Neurological: Negative for speech change, focal weakness and weakness.  Endo/Heme/Allergies: Does not bruise/bleed easily.       No temperature intolerance  Psychiatric/Behavioral: Negative for depression and suicidal ideas.    Blood pressure  128/76, pulse 56, temperature 97.4 F (36.3 C), resp. rate 14, height 6' (1.829 m), weight 86.183 kg (190 lb), SpO2 98 %. Physical Exam  Nursing note and vitals reviewed. Constitutional: He is oriented to person, place, and time. He appears well-developed and well-nourished. No distress.  HENT:  Head: Normocephalic and atraumatic.  Mouth/Throat: Oropharynx is clear and moist.  Eyes: Conjunctivae and EOM are normal. Pupils are equal, round, and reactive to light. No scleral icterus.  Neck: Normal range of motion. Neck supple. No JVD present. No tracheal deviation present. No thyromegaly present.  Cardiovascular: Normal rate, regular rhythm and normal heart sounds.  Exam reveals  no gallop and no friction rub.   No murmur heard. Respiratory: Effort normal and breath sounds normal.  GI: Soft. Bowel sounds are normal. He exhibits no distension. There is no tenderness.  Genitourinary:  Deferred  Musculoskeletal: Normal range of motion. He exhibits no edema.  Lymphadenopathy:    He has no cervical adenopathy.  Neurological: He is alert and oriented to person, place, and time. No cranial nerve deficit.  Skin: Skin is warm and dry. No rash noted. No erythema.  Psychiatric: He has a normal mood and affect. His behavior is normal. Judgment and thought content normal.     Assessment/Plan This is a 64 year old Caucasian male admitted for near syncope and mildly abnormal EKG. 1. Near syncope: Possibly secondary to hypoglycemia. The patient admits to poor by mouth intake today. Physiologic stress from illness (pharyngitis) may have contributed to symptoms as well. Now the patient's blood sugar is high following soda and glucose ingestion. He admits that he's had problems with hypoglycemia when he was first diagnosed with diabetes as an adult. These episodes occurred when his primary care doctor was trying to establish appropriate regimen of oral hypoglycemics and long-acting insulin. Symptoms unlikely  related to arrhythmia however we will evaluate the patient on telemetry overnight. Follow cardiac enzymes. Cardiology consult ordered. Continue aspirin 2. Abnormal EKG: ST segment appears to show early repolarization. Unlikely to be etiology of near syncope. 3. Diabetes mellitus type II: We will continue the patient's basal insulin and add sliding scale as needed while hospitalized. The patient is on Invokana which has a relatively high incidence of hypoglycemia. The patient is also somewhat dehydrated likely secondary to poor by mouth intake and increased micturition as a side effect of the medication. I have discontinued this medication and recommend discontinuation upon discharge from hospital as well. 4. Hyperlipidemia: Check lipid panel; continue statin therapy 5. Hyponatremia: Mild; hydrate with saline. 6. Depression: Continue Paxil 7. DVT prophylaxis: Heparin  8. GI prophylaxis: None as the patient is a critically ill at this time  The patient is a full code. Time spent on admission was inpatient care approximately 45 minutes  Harrie Foreman 06/30/2015, 2:45 AM

## 2015-07-02 NOTE — Discharge Summary (Signed)
Mike LennoxJames David Ramos, 64 y.o., DOB 04/26/1951, MRN [id number]. 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 Patient had EKG which showed some early repolarization therefore he was admitted under observation. On further questioning there was some concern with family regarding speech difficulties. Therefore he underwent MRI which showed a remote CVA nothing acute. Patient was very anxious to be discharged. Since he lived in [place]. So he is currently being discharged with follow-up primary care provider.          Consults  None  Significant Tests:  See full reports for all details      Ct Head Wo Contrast  06/30/2015  CLINICAL DATA:  Lightheadedness and diaphoresis.  Near syncope. EXAM: CT HEAD WITHOUT CONTRAST TECHNIQUE: Contiguous axial images were obtained from the base of the skull through the vertex without intravenous contrast. COMPARISON:  None. FINDINGS: There is no intracranial hemorrhage, mass or evidence of acute infarction. There is focal encephalomalacia in the anterior right temporal lobe, likely sequelae from remote insult such as trauma or infarction. Remainder of the brain is normal in appearance, with normal gray-white differentiation. No extra-axial fluid collection. No bony abnormality. The visible paranasal sinuses are clear except for opacification of several ethmoid air cells. IMPRESSION: No acute intracranial findings. 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 Electronically Signed   By: Ellery Plunkaniel R Mitchell M.D.   On: 06/30/2015 01:13   Mr Brain Wo Contrast  06/30/2015  CLINICAL DATA:  Weakness and nausea. Diaphoresis. Symptoms began earlier today. EXAM: MRI HEAD WITHOUT CONTRAST TECHNIQUE: Multiplanar, multiecho pulse sequences of the brain and surrounding structures were obtained without intravenous contrast. COMPARISON:  CT head earlier today. FINDINGS: No evidence for acute infarction, hemorrhage, mass lesion, hydrocephalus, or extra-axial fluid. Mild cerebral and cerebellar atrophy. Remote RIGHT anterior and lateral temporal lobe infarct, with gliosis and encephalomalacia. Flow voids are maintained throughout the carotid, basilar, and vertebral arteries. There are no areas of chronic hemorrhage. Pituitary, pineal, and cerebellar tonsils unremarkable. No upper cervical lesions. Visualized calvarium, skull base, and upper cervical osseous structures unremarkable. Scalp and extracranial soft tissues, orbits, sinuses, and mastoids show no acute process. IMPRESSION: Remote RIGHT temporal lobe infarct. No acute intracranial abnormality. No large vessel occlusion. Electronically Signed   By: Elsie StainJohn T Curnes M.D.   On: 06/30/2015 16:40       Today   Subjective:   Mike OxfordJames Ramos feels well denies any symptoms  Objective:   Blood pressure 140/86, pulse 76, temperature 97.5 F (36.4 C), temperature source Axillary, resp. rate 18, height 6' (1.829 m), weight 81.784 kg (180 lb 4.8 oz), SpO2 100 %.  . No intake or output data in the 24 hours ending 07/02/15 1539  Exam VITAL SIGNS: Blood pressure 140/86, pulse 76, temperature 97.5 F (36.4 C), temperature source Axillary, resp. rate 18, height 6' (1.829 m), weight 81.784 kg (180 lb 4.8 oz), SpO2 100 %.  GENERAL:  64 y.o.-year-old patient lying in the bed with no acute distress.  EYES: Pupils equal, round, reactive to light  and accommodation. No scleral icterus. Extraocular muscles intact.   HEENT: Head atraumatic, normocephalic. Oropharynx and nasopharynx clear.  NECK:  Supple, no jugular venous distention. No thyroid enlargement, no tenderness.  LUNGS: Normal breath sounds bilaterally, no wheezing, rales,rhonchi or crepitation. No use of accessory muscles of respiration.  CARDIOVASCULAR: S1, S2 normal. No murmurs, rubs, or gallops.  ABDOMEN: Soft, nontender, nondistended. Bowel sounds present. No organomegaly or mass.  EXTREMITIES: No pedal edema, cyanosis, or clubbing.  NEUROLOGIC: Cranial nerves II through XII are intact. Muscle strength 5/5 in all extremities. Sensation intact. Gait not checked.  PSYCHIATRIC: The patient is alert and oriented x 3.  SKIN: No obvious rash, lesion, or ulcer.   Data Review     CBC w Diff:  Lab Results  Component Value Date   WBC 10.4 06/29/2015   HGB 15.2 06/29/2015   HCT 45.3 06/29/2015   PLT 200 06/29/2015   CMP:  Lab Results  Component Value Date   NA 133* 06/29/2015   K 3.6 06/29/2015   CL 100* 06/29/2015   CO2 23 06/29/2015   BUN 20 06/29/2015   CREATININE 0.91 06/29/2015  .  Micro Results No results found for this or any previous visit (from the past 240 hour(s)).         Follow-up Information    Follow up with Primary Care Physician/Family doctor In 3 days.      Discharge Medications     Medication List    TAKE these medications        aspirin EC 81 MG tablet  Take 162 mg by mouth daily.     atorvastatin 40 MG tablet  Commonly known as:  LIPITOR  Take 1 tablet by mouth every evening.     esomeprazole 20 MG capsule  Commonly known as:  NEXIUM  Take 20 mg by mouth daily at 12 noon.     insulin glargine 100 UNIT/ML injection  Commonly known as:  LANTUS  Inject 10 Units into the skin at bedtime.     INVOKANA 100 MG Tabs tablet  Generic drug:  canagliflozin  Take 100 mg by mouth daily before breakfast.     PARoxetine 10 MG tablet  Commonly known as:  PAXIL  Take 1 tablet by mouth daily.       ASK your doctor about these medications        azithromycin 250 MG tablet  Commonly known as:  ZITHROMAX  Take 1 tablet by mouth daily.  Ask about: Should I take this medication?           Total Time in preparing paper work, data evaluation and todays exam - 35 minutes  Auburn Bilberry M.D on 07/02/2015 at 3:39 PM  [facility]   Office  [phone]

## 2016-06-22 IMAGING — CT CT HEAD W/O CM
1 series · 16 of 30 positions shown, 20 images · non-contrast
Comparison: None.

CLINICAL DATA: Lightheadedness and diaphoresis.  Near syncope.

EXAM:
CT HEAD WITHOUT CONTRAST
TECHNIQUE: Contiguous axial images were obtained from the base of the skull
through the vertex without intravenous contrast.

[Series 2: head wo · axial · 0.47mm/px · z∈[+266,+410]mm · 16 of 36 slices shown, 20 images]
[im 2/36  brain]
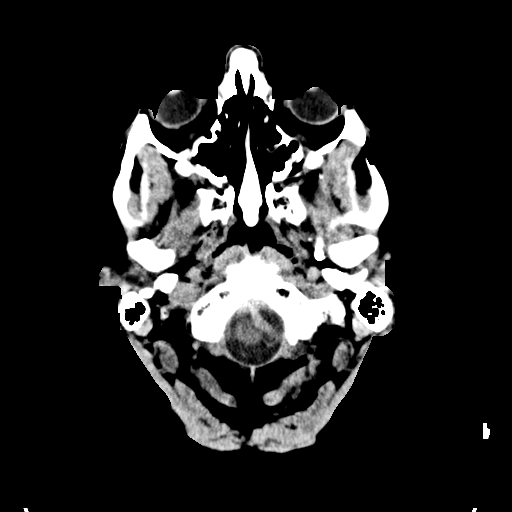
[im 2/36  bone]
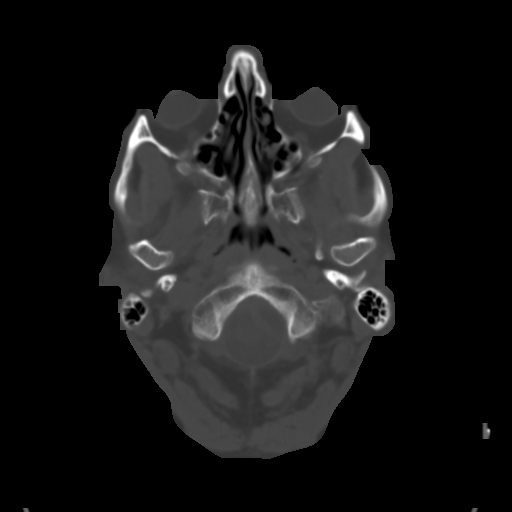
[im 4/36  brain]
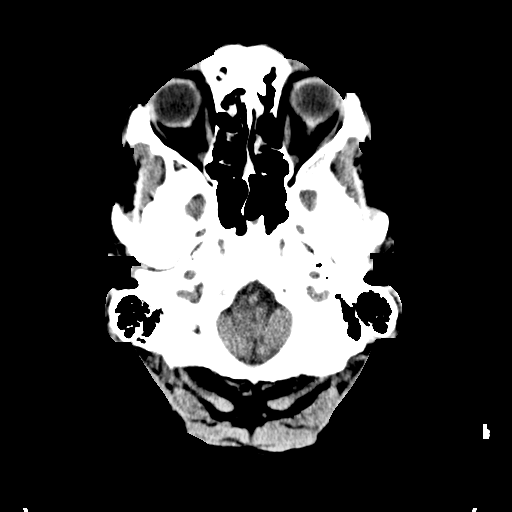
[im 7/36  brain]
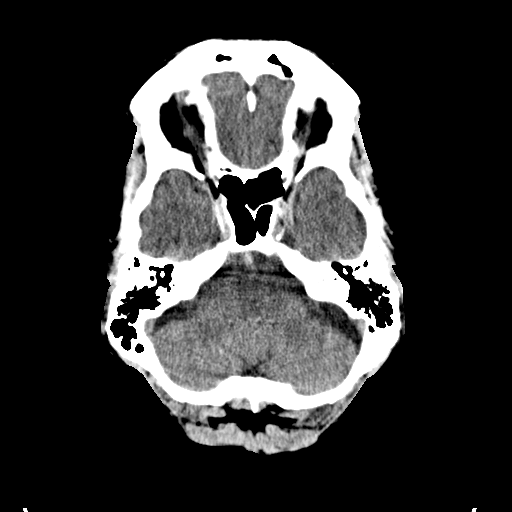
[im 9/36  brain]
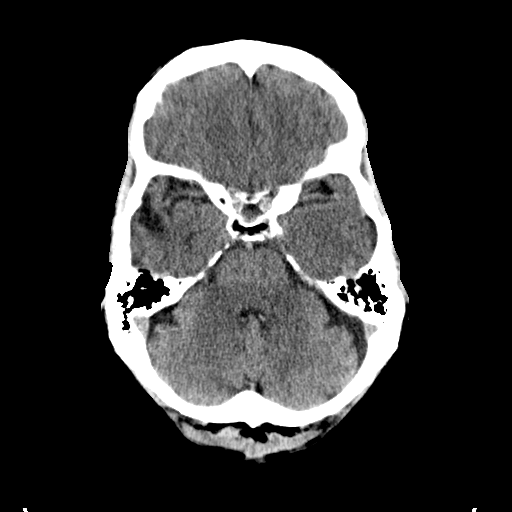
[im 10/36  brain]
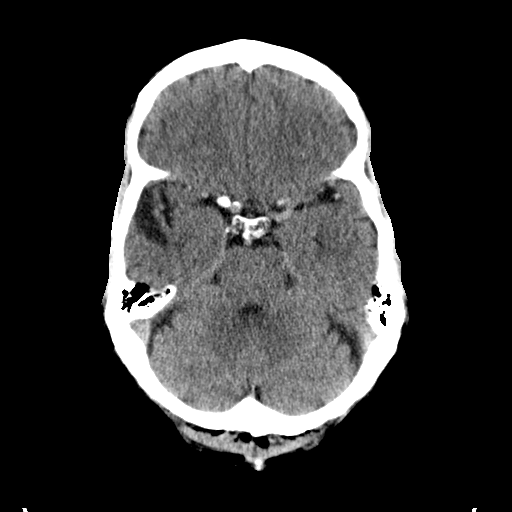
[im 10/36  bone]
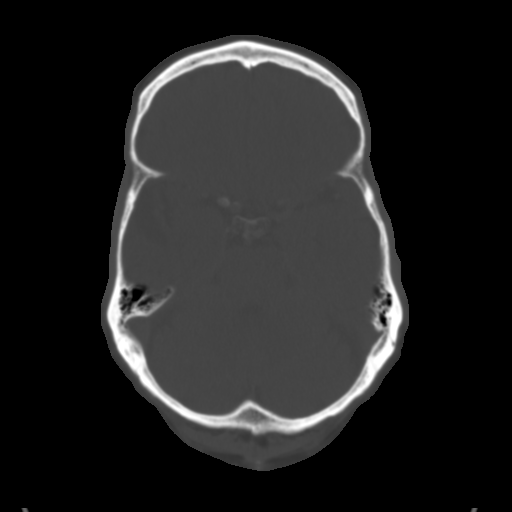
[im 13/36  brain]
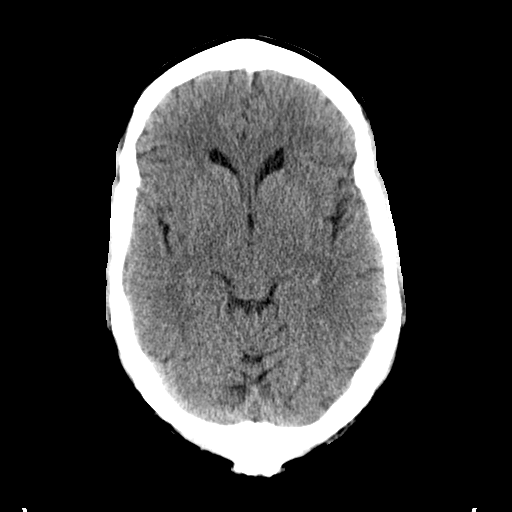
[im 15/36  brain]
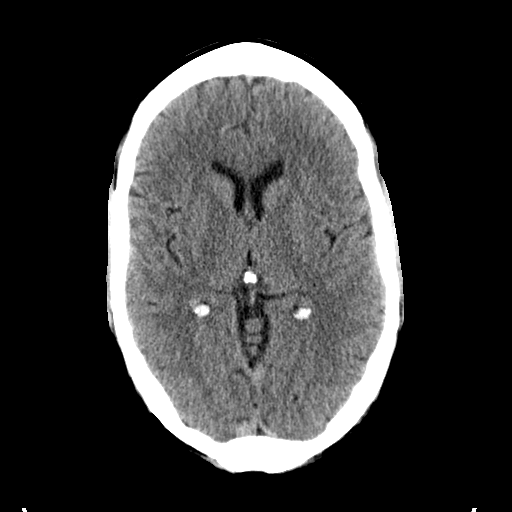
[im 17/36  brain]
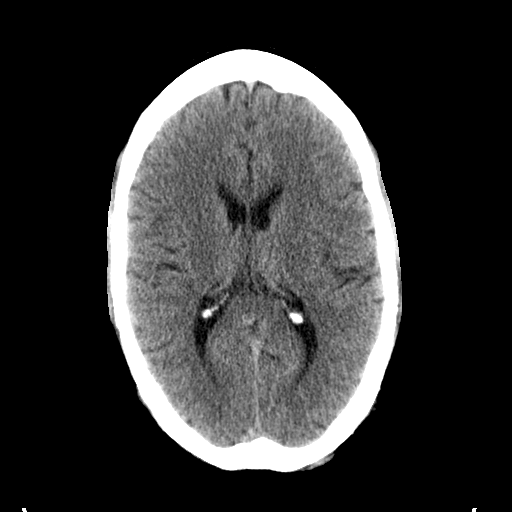
[im 19/36  brain]
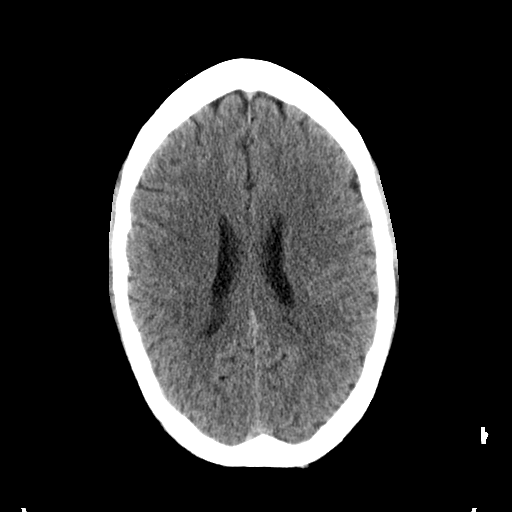
[im 19/36  bone]
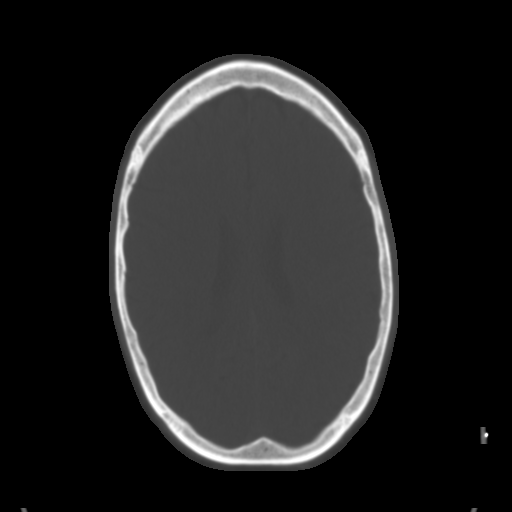
[im 21/36  brain]
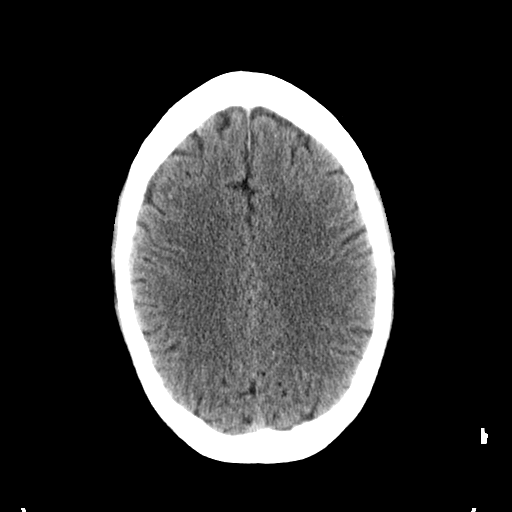
[im 23/36  brain]
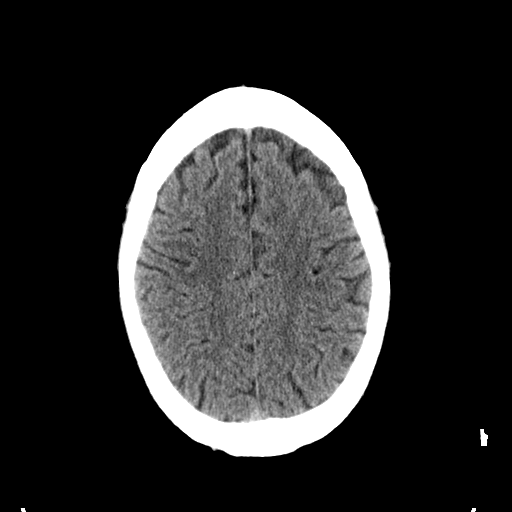
[im 26/36  brain]
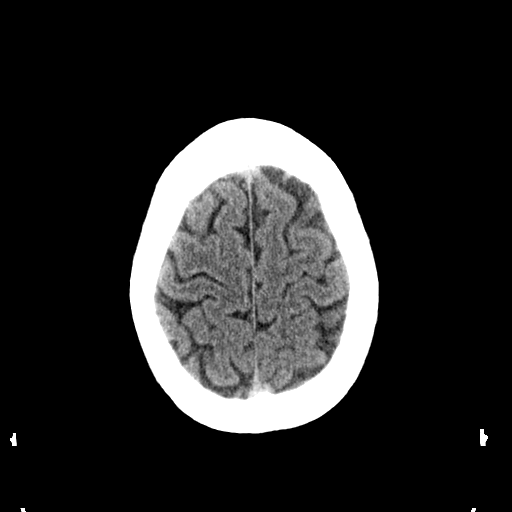
[im 27/36  brain]
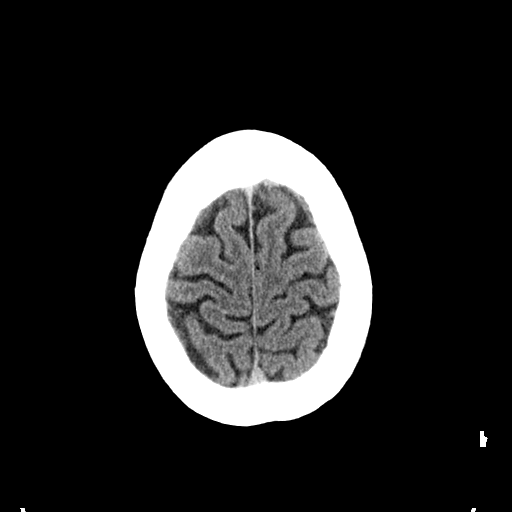
[im 27/36  bone]
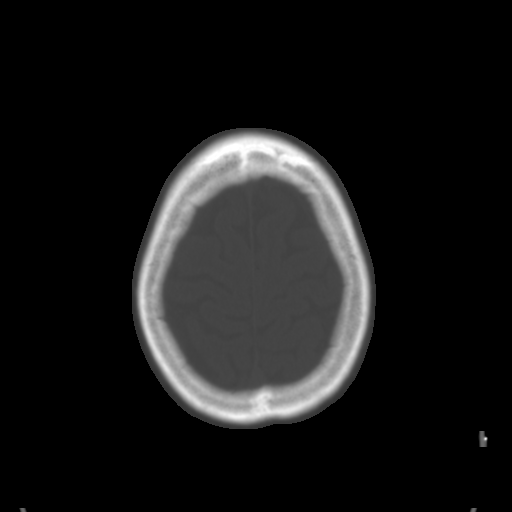
[im 29/36  brain]
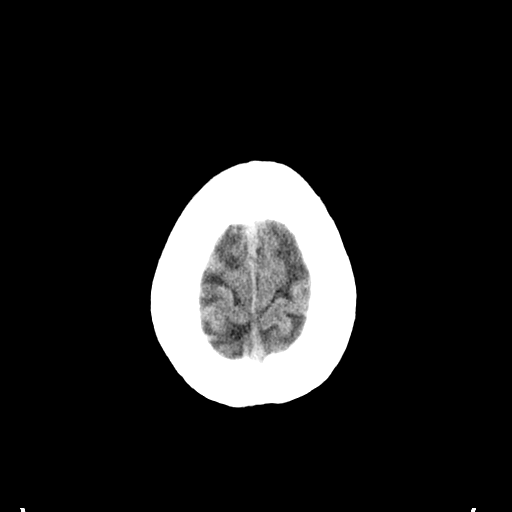
[im 32/36  brain]
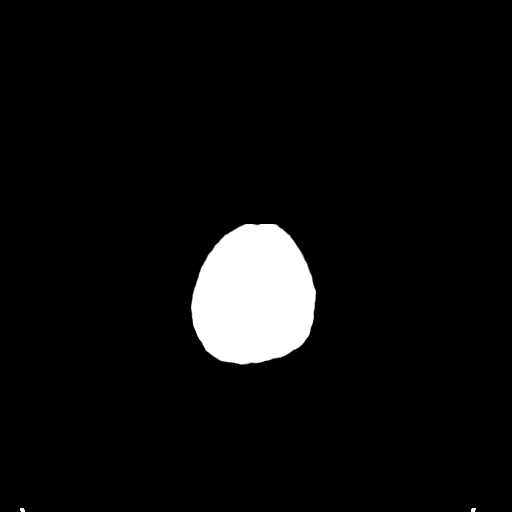
[im 34/36  brain]
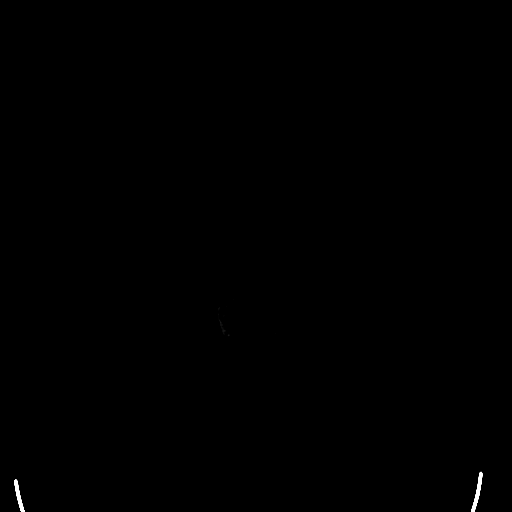

[16 of 30 positions shown; findings below may reference images not displayed]

FINDINGS: There is no intracranial hemorrhage, mass or evidence of acute
infarction. There is focal encephalomalacia in the anterior right
temporal lobe, likely sequelae from remote insult such as trauma or
infarction. Remainder of the brain is normal in appearance, with
normal gray-white differentiation. No extra-axial fluid collection.
No bony abnormality. The visible paranasal sinuses are clear except
for opacification of several ethmoid air cells.
IMPRESSION: No acute intracranial findings. Anterior right temporal
encephalomalacia, likely due to remote insult.
